# Patient Record
Sex: Male | Born: 1980 | Race: Black or African American | Hispanic: No | Marital: Married | Smoking: Current every day smoker
Health system: Southern US, Community
[De-identification: ages and names within clinical notes are randomized; demographics above are authoritative.]

## PROBLEM LIST (undated history)

## (undated) DIAGNOSIS — N200 Calculus of kidney: Secondary | ICD-10-CM

## (undated) DIAGNOSIS — F329 Major depressive disorder, single episode, unspecified: Secondary | ICD-10-CM

## (undated) DIAGNOSIS — F32A Depression, unspecified: Secondary | ICD-10-CM

## (undated) HISTORY — PX: KIDNEY STONE SURGERY: SHX686

---
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
## 2005-03-13 ENCOUNTER — Emergency Department (HOSPITAL_COMMUNITY): Admission: EM | Admit: 2005-03-13 | Discharge: 2005-03-13 | Payer: Self-pay | Admitting: Emergency Medicine

## 2005-04-27 ENCOUNTER — Emergency Department (HOSPITAL_COMMUNITY): Admission: EM | Admit: 2005-04-27 | Discharge: 2005-04-28 | Payer: Self-pay | Admitting: Emergency Medicine

## 2005-07-25 ENCOUNTER — Emergency Department (HOSPITAL_COMMUNITY): Admission: EM | Admit: 2005-07-25 | Discharge: 2005-07-25 | Payer: Self-pay | Admitting: Emergency Medicine

## 2005-09-28 ENCOUNTER — Emergency Department (HOSPITAL_COMMUNITY): Admission: EM | Admit: 2005-09-28 | Discharge: 2005-09-28 | Payer: Self-pay | Admitting: Emergency Medicine

## 2006-01-09 DIAGNOSIS — K047 Periapical abscess without sinus: Secondary | ICD-10-CM | POA: Insufficient documentation

## 2006-01-10 ENCOUNTER — Ambulatory Visit: Payer: Self-pay | Admitting: Internal Medicine

## 2006-01-11 ENCOUNTER — Ambulatory Visit: Payer: Self-pay | Admitting: *Deleted

## 2006-03-15 ENCOUNTER — Emergency Department (HOSPITAL_COMMUNITY): Admission: EM | Admit: 2006-03-15 | Discharge: 2006-03-15 | Payer: Self-pay | Admitting: Emergency Medicine

## 2006-03-17 ENCOUNTER — Emergency Department (HOSPITAL_COMMUNITY): Admission: EM | Admit: 2006-03-17 | Discharge: 2006-03-18 | Payer: Self-pay | Admitting: Emergency Medicine

## 2006-03-20 ENCOUNTER — Emergency Department (HOSPITAL_COMMUNITY): Admission: EM | Admit: 2006-03-20 | Discharge: 2006-03-20 | Payer: Self-pay | Admitting: Family Medicine

## 2006-04-25 ENCOUNTER — Ambulatory Visit: Payer: Self-pay | Admitting: Family Medicine

## 2006-05-01 ENCOUNTER — Emergency Department (HOSPITAL_COMMUNITY): Admission: EM | Admit: 2006-05-01 | Discharge: 2006-05-01 | Payer: Self-pay | Admitting: Emergency Medicine

## 2006-05-05 ENCOUNTER — Ambulatory Visit (HOSPITAL_COMMUNITY): Admission: RE | Admit: 2006-05-05 | Discharge: 2006-05-05 | Payer: Self-pay | Admitting: Urology

## 2006-07-17 ENCOUNTER — Emergency Department (HOSPITAL_COMMUNITY): Admission: EM | Admit: 2006-07-17 | Discharge: 2006-07-17 | Payer: Self-pay | Admitting: Emergency Medicine

## 2006-07-18 ENCOUNTER — Ambulatory Visit: Payer: Self-pay | Admitting: Internal Medicine

## 2006-07-18 DIAGNOSIS — M7650 Patellar tendinitis, unspecified knee: Secondary | ICD-10-CM

## 2006-07-18 DIAGNOSIS — Z8739 Personal history of other diseases of the musculoskeletal system and connective tissue: Secondary | ICD-10-CM | POA: Insufficient documentation

## 2006-07-20 ENCOUNTER — Ambulatory Visit: Payer: Self-pay | Admitting: Internal Medicine

## 2006-08-10 ENCOUNTER — Ambulatory Visit: Payer: Self-pay | Admitting: Internal Medicine

## 2006-12-05 ENCOUNTER — Emergency Department (HOSPITAL_COMMUNITY): Admission: EM | Admit: 2006-12-05 | Discharge: 2006-12-05 | Payer: Self-pay | Admitting: *Deleted

## 2006-12-06 ENCOUNTER — Emergency Department (HOSPITAL_COMMUNITY): Admission: EM | Admit: 2006-12-06 | Discharge: 2006-12-06 | Payer: Self-pay | Admitting: *Deleted

## 2007-03-27 ENCOUNTER — Ambulatory Visit: Payer: Self-pay | Admitting: Family Medicine

## 2007-03-27 DIAGNOSIS — Z87442 Personal history of urinary calculi: Secondary | ICD-10-CM

## 2007-06-13 ENCOUNTER — Encounter (INDEPENDENT_AMBULATORY_CARE_PROVIDER_SITE_OTHER): Payer: Self-pay | Admitting: *Deleted

## 2007-07-15 ENCOUNTER — Emergency Department (HOSPITAL_COMMUNITY): Admission: EM | Admit: 2007-07-15 | Discharge: 2007-07-15 | Payer: Self-pay | Admitting: Emergency Medicine

## 2007-10-17 ENCOUNTER — Ambulatory Visit: Payer: Self-pay | Admitting: Nurse Practitioner

## 2008-12-17 ENCOUNTER — Emergency Department (HOSPITAL_COMMUNITY): Admission: EM | Admit: 2008-12-17 | Discharge: 2008-12-17 | Payer: Self-pay | Admitting: Emergency Medicine

## 2009-03-06 ENCOUNTER — Emergency Department (HOSPITAL_COMMUNITY): Admission: EM | Admit: 2009-03-06 | Discharge: 2009-03-07 | Payer: Self-pay | Admitting: Emergency Medicine

## 2009-03-21 ENCOUNTER — Emergency Department (HOSPITAL_COMMUNITY): Admission: EM | Admit: 2009-03-21 | Discharge: 2009-03-21 | Payer: Self-pay | Admitting: Emergency Medicine

## 2009-05-09 ENCOUNTER — Emergency Department (HOSPITAL_COMMUNITY): Admission: EM | Admit: 2009-05-09 | Discharge: 2009-05-09 | Payer: Self-pay | Admitting: Emergency Medicine

## 2009-05-14 ENCOUNTER — Emergency Department (HOSPITAL_COMMUNITY): Admission: EM | Admit: 2009-05-14 | Discharge: 2009-05-14 | Payer: Self-pay | Admitting: Emergency Medicine

## 2009-08-10 ENCOUNTER — Emergency Department (HOSPITAL_COMMUNITY): Admission: EM | Admit: 2009-08-10 | Discharge: 2009-08-10 | Payer: Self-pay | Admitting: Family Medicine

## 2009-08-10 ENCOUNTER — Emergency Department (HOSPITAL_COMMUNITY): Admission: EM | Admit: 2009-08-10 | Discharge: 2009-08-10 | Payer: Self-pay | Admitting: Emergency Medicine

## 2009-11-17 ENCOUNTER — Emergency Department (HOSPITAL_COMMUNITY): Admission: EM | Admit: 2009-11-17 | Discharge: 2009-11-17 | Payer: Self-pay | Admitting: Emergency Medicine

## 2009-11-18 ENCOUNTER — Emergency Department (HOSPITAL_COMMUNITY): Admission: EM | Admit: 2009-11-18 | Discharge: 2009-11-18 | Payer: Self-pay | Admitting: Emergency Medicine

## 2009-12-08 ENCOUNTER — Emergency Department (HOSPITAL_COMMUNITY): Admission: EM | Admit: 2009-12-08 | Discharge: 2009-12-08 | Payer: Self-pay | Admitting: Emergency Medicine

## 2010-12-15 LAB — CBC
HCT: 43.1 % (ref 39.0–52.0)
Hemoglobin: 14.8 g/dL (ref 13.0–17.0)
MCHC: 34.5 g/dL (ref 30.0–36.0)
MCV: 88.3 fL (ref 78.0–100.0)
Platelets: 229 10*3/uL (ref 150–400)
RBC: 4.88 MIL/uL (ref 4.22–5.81)
RDW: 13.9 % (ref 11.5–15.5)
WBC: 5.8 10*3/uL (ref 4.0–10.5)

## 2010-12-15 LAB — POCT CARDIAC MARKERS
CKMB, poc: <1 ng/mL — ABNORMAL LOW (ref 1.0–8.0)
Myoglobin, poc: 26.5 ng/mL (ref 12–200)
Troponin i, poc: <0.05 ng/mL (ref 0.00–0.09)

## 2010-12-15 LAB — DIFFERENTIAL
Basophils Absolute: 0 10*3/uL (ref 0.0–0.1)
Basophils Relative: 0 % (ref 0–1)
Eosinophils Absolute: 0 10*3/uL (ref 0.0–0.7)
Eosinophils Relative: 0 % (ref 0–5)
Lymphocytes Relative: 38 % (ref 12–46)
Lymphs Abs: 2.2 10*3/uL (ref 0.7–4.0)
Monocytes Absolute: 0.6 10*3/uL (ref 0.1–1.0)
Monocytes Relative: 11 % (ref 3–12)
Neutro Abs: 3 10*3/uL (ref 1.7–7.7)
Neutrophils Relative %: 51 % (ref 43–77)

## 2010-12-15 LAB — BASIC METABOLIC PANEL
BUN: 10 mg/dL (ref 6–23)
CO2: 27 mEq/L (ref 19–32)
Calcium: 9.5 mg/dL (ref 8.4–10.5)
Chloride: 107 mEq/L (ref 96–112)
Creatinine, Ser: 0.9 mg/dL (ref 0.4–1.5)
GFR calc Af Amer: >60 mL/min (ref >60)
GFR calc non Af Amer: >60 mL/min (ref >60)
Glucose, Bld: 104 mg/dL — ABNORMAL HIGH (ref 70–99)
Potassium: 3.4 mEq/L — ABNORMAL LOW (ref 3.5–5.1)
Sodium: 139 mEq/L (ref 135–145)

## 2010-12-15 LAB — D-DIMER, QUANTITATIVE: D-Dimer, Quant: <0.22 ug/mL-FEU (ref 0.00–0.48)

## 2010-12-29 LAB — GC/CHLAMYDIA PROBE AMP, GENITAL
Chlamydia, DNA Probe: NEGATIVE
GC Probe Amp, Genital: POSITIVE — AB

## 2011-01-01 LAB — GC/CHLAMYDIA PROBE AMP, GENITAL: Chlamydia, DNA Probe: NEGATIVE

## 2011-01-03 LAB — POCT CARDIAC MARKERS
CKMB, poc: <1 ng/mL — ABNORMAL LOW (ref 1.0–8.0)
Myoglobin, poc: 91.3 ng/mL (ref 12–200)
Troponin i, poc: <0.05 ng/mL (ref 0.00–0.09)

## 2011-01-03 LAB — POCT I-STAT, CHEM 8
BUN: 10 mg/dL (ref 6–23)
Calcium, Ion: 1.26 mmol/L (ref 1.12–1.32)
HCT: 47 % (ref 39.0–52.0)
Sodium: 142 mEq/L (ref 135–145)
TCO2: 22 mmol/L (ref 0–100)

## 2011-01-06 LAB — BASIC METABOLIC PANEL
BUN: 15 mg/dL (ref 6–23)
CO2: 28 mEq/L (ref 19–32)
Calcium: 9.7 mg/dL (ref 8.4–10.5)
Chloride: 105 mEq/L (ref 96–112)
Creatinine, Ser: 0.87 mg/dL (ref 0.4–1.5)
GFR calc Af Amer: >60 mL/min (ref >60)
Glucose, Bld: 92 mg/dL (ref 70–99)

## 2011-01-06 LAB — DIFFERENTIAL
Basophils Absolute: 0 10*3/uL (ref 0.0–0.1)
Basophils Relative: 0 % (ref 0–1)
Eosinophils Absolute: 0.1 10*3/uL (ref 0.0–0.7)
Lymphocytes Relative: 25 % (ref 12–46)
Monocytes Absolute: 0.4 10*3/uL (ref 0.1–1.0)
Neutrophils Relative %: 68 % (ref 43–77)

## 2011-01-06 LAB — CBC
MCHC: 33.3 g/dL (ref 30.0–36.0)
MCV: 89.9 fL (ref 78.0–100.0)
Platelets: 207 10*3/uL (ref 150–400)
RDW: 15.2 % (ref 11.5–15.5)

## 2011-01-06 LAB — SICKLE CELL SCREEN: Sickle Cell Screen: NEGATIVE

## 2011-02-11 NOTE — Op Note (Signed)
NAME:  Ryan Duran, Ryan Duran               ACCOUNT NO.:  [id number]   MEDICAL RECORD NO.:  [id number]          PATIENT TYPE:  AMB   LOCATION:  DAY                          FACILITY:  [facility]   PHYSICIAN:  Boston Service, M.D.DATE OF BIRTH:  02/04/1983   DATE OF PROCEDURE:  04/27/2006  DATE OF DISCHARGE:  05/05/2006                                 OPERATIVE REPORT   PREOPERATIVE DIAGNOSIS:  Right ureteral stone times 6-8 weeks.  The patient  has apparently had six visits to the emergency room over the last 12 months,  I have made a good faith effort to review those records.   POSTOPERATIVE DIAGNOSIS:  Right ureteral stone times 6-8 weeks.  The patient  has apparently had six visits to the emergency room over the last 12 months,  I have made a good faith effort to review those records.   PROCEDURE:  Cystoscopy, ureteroscopy and stone manipulation.  Reference is  made to retrograde films.   SURGEON:  Boston Service, MD.   ASSISTANTS:  None.   ANESTHESIA:  General.   SPECIMENS:  Stone.   ESTIMATED BLOOD LOSS:  Minimal.   COMPLICATIONS:  None obvious.   DESCRIPTION OF PROCEDURE:  The patient was prepped and draped in the  dorsolithotomy position after institution of adequate level of general  anesthesia.  A well lubricated 21-French panendoscope was gently inserted at  the urethral meatus, normal urethra and sphincter, nonobstructive prostate,  clear efflux left orifice, minimal efflux right orifice.   Blocking catheter was selected, positioned at the left ureteral orifice.  Retrograde films with 3-6 mL of contrast outlined fairly normal course and  caliber of the ureter, pelvis and calyces.  Similar technique was used on  the right side and showed what appeared to be about 10 mm filling defect  within the right mid ureter.  Difficult to be certain if this represented  one stone or two smaller stones in fairly close proximity.  Once retrograde  films had been obtained, floppy tip  guidewire was inserted at the right  ureteral orifice, advanced easily into the upper pole calyces and  ureteroscope was then inserted alongside the guide wire.  Stone was  localized within the distal ureter instead of single stone, it was shown to  be a collection of 2 or 3 smaller stones which were negotiated into the Bard  basket and then gently withdrawn.  Ureteroscope was reinserted, ureter was  carefully inspected to the limit of the short 6-French scope and there was  no evidence of ureteral trauma or disruption.  There was also no evidence of  retained  stony fragments.  Ureteroscope was then gently withdrawn as was the floppy  tip guidewire.  There was prompt reflux of pink urine from the right  ureteral orifice.  No attempt was made to place a double-J stent the  patient's bladder was drained.  He was given a B&P suppository and returned  to recovery in satisfactory condition.           ______________________________  Boston Service, M.D.     RH/MEDQ  D:  06/01/2006  T:  06/01/2006  Job:  [id number]

## 2011-03-04 ENCOUNTER — Inpatient Hospital Stay (INDEPENDENT_AMBULATORY_CARE_PROVIDER_SITE_OTHER)
Admission: RE | Admit: 2011-03-04 | Discharge: 2011-03-04 | Disposition: A | Discharge status: Home / Self Care | Payer: Self-pay | Source: Ambulatory Visit | Attending: Emergency Medicine | Admitting: Emergency Medicine

## 2011-03-04 DIAGNOSIS — R369 Urethral discharge, unspecified: Secondary | ICD-10-CM

## 2011-03-08 ENCOUNTER — Emergency Department (HOSPITAL_COMMUNITY)
Admission: EM | Admit: 2011-03-08 | Discharge: 2011-03-08 | Disposition: A | Discharge status: Home / Self Care | Payer: Self-pay | Attending: Emergency Medicine | Admitting: Emergency Medicine

## 2011-03-08 DIAGNOSIS — K029 Dental caries, unspecified: Secondary | ICD-10-CM | POA: Insufficient documentation

## 2011-03-08 DIAGNOSIS — J45909 Unspecified asthma, uncomplicated: Secondary | ICD-10-CM | POA: Insufficient documentation

## 2011-03-08 DIAGNOSIS — S30860A Insect bite (nonvenomous) of lower back and pelvis, initial encounter: Secondary | ICD-10-CM | POA: Insufficient documentation

## 2011-03-08 DIAGNOSIS — W57XXXA Bitten or stung by nonvenomous insect and other nonvenomous arthropods, initial encounter: Secondary | ICD-10-CM | POA: Insufficient documentation

## 2011-03-08 DIAGNOSIS — K089 Disorder of teeth and supporting structures, unspecified: Secondary | ICD-10-CM | POA: Insufficient documentation

## 2011-04-22 ENCOUNTER — Encounter (HOSPITAL_COMMUNITY): Payer: Self-pay

## 2011-04-22 ENCOUNTER — Emergency Department (HOSPITAL_COMMUNITY)
Admission: EM | Admit: 2011-04-22 | Discharge: 2011-04-22 | Disposition: A | Discharge status: Home / Self Care | Payer: Self-pay | Attending: Emergency Medicine | Admitting: Emergency Medicine

## 2011-04-22 ENCOUNTER — Emergency Department (HOSPITAL_COMMUNITY): Payer: Self-pay

## 2011-04-22 DIAGNOSIS — M25519 Pain in unspecified shoulder: Secondary | ICD-10-CM | POA: Insufficient documentation

## 2011-04-22 DIAGNOSIS — S40019A Contusion of unspecified shoulder, initial encounter: Secondary | ICD-10-CM | POA: Insufficient documentation

## 2011-04-22 DIAGNOSIS — Y92009 Unspecified place in unspecified non-institutional (private) residence as the place of occurrence of the external cause: Secondary | ICD-10-CM | POA: Insufficient documentation

## 2011-04-22 DIAGNOSIS — W010XXA Fall on same level from slipping, tripping and stumbling without subsequent striking against object, initial encounter: Secondary | ICD-10-CM | POA: Insufficient documentation

## 2011-05-01 ENCOUNTER — Emergency Department (HOSPITAL_COMMUNITY)
Admission: EM | Admit: 2011-05-01 | Discharge: 2011-05-01 | Disposition: A | Discharge status: Home / Self Care | Payer: Self-pay | Attending: Emergency Medicine | Admitting: Emergency Medicine

## 2011-05-01 ENCOUNTER — Emergency Department (HOSPITAL_COMMUNITY): Payer: Self-pay

## 2011-05-01 DIAGNOSIS — K029 Dental caries, unspecified: Secondary | ICD-10-CM | POA: Insufficient documentation

## 2011-05-01 DIAGNOSIS — R0609 Other forms of dyspnea: Secondary | ICD-10-CM | POA: Insufficient documentation

## 2011-05-01 DIAGNOSIS — R0602 Shortness of breath: Secondary | ICD-10-CM | POA: Insufficient documentation

## 2011-05-01 DIAGNOSIS — R0989 Other specified symptoms and signs involving the circulatory and respiratory systems: Secondary | ICD-10-CM | POA: Insufficient documentation

## 2011-05-01 DIAGNOSIS — K089 Disorder of teeth and supporting structures, unspecified: Secondary | ICD-10-CM | POA: Insufficient documentation

## 2011-05-01 DIAGNOSIS — J45901 Unspecified asthma with (acute) exacerbation: Secondary | ICD-10-CM | POA: Insufficient documentation

## 2011-05-01 DIAGNOSIS — K047 Periapical abscess without sinus: Secondary | ICD-10-CM | POA: Insufficient documentation

## 2011-05-01 DIAGNOSIS — R5381 Other malaise: Secondary | ICD-10-CM | POA: Insufficient documentation

## 2011-05-01 DIAGNOSIS — R110 Nausea: Secondary | ICD-10-CM | POA: Insufficient documentation

## 2011-05-01 DIAGNOSIS — R6883 Chills (without fever): Secondary | ICD-10-CM | POA: Insufficient documentation

## 2011-05-01 LAB — CBC
HCT: 43.7 % (ref 39.0–52.0)
MCH: 29.8 pg (ref 26.0–34.0)
MCV: 86.2 fL (ref 78.0–100.0)
RBC: 5.07 MIL/uL (ref 4.22–5.81)
RDW: 13.9 % (ref 11.5–15.5)
WBC: 6 10*3/uL (ref 4.0–10.5)

## 2011-05-01 LAB — URINALYSIS, ROUTINE W REFLEX MICROSCOPIC
Bilirubin Urine: NEGATIVE
Glucose, UA: NEGATIVE mg/dL
Hgb urine dipstick: NEGATIVE
Protein, ur: NEGATIVE mg/dL

## 2011-05-01 LAB — POCT I-STAT, CHEM 8
BUN: <3 mg/dL — ABNORMAL LOW (ref 6–23)
Creatinine, Ser: 1 mg/dL (ref 0.50–1.35)
Glucose, Bld: 86 mg/dL (ref 70–99)
Sodium: 140 mEq/L (ref 135–145)
TCO2: 28 mmol/L (ref 0–100)

## 2011-05-01 LAB — RAPID URINE DRUG SCREEN, HOSP PERFORMED
Amphetamines: NOT DETECTED
Barbiturates: NOT DETECTED
Benzodiazepines: NOT DETECTED

## 2011-05-14 ENCOUNTER — Emergency Department (HOSPITAL_COMMUNITY)
Admission: EM | Admit: 2011-05-14 | Discharge: 2011-05-14 | Disposition: A | Discharge status: Home / Self Care | Payer: Self-pay | Attending: Emergency Medicine | Admitting: Emergency Medicine

## 2011-05-14 ENCOUNTER — Emergency Department (HOSPITAL_COMMUNITY): Payer: Self-pay

## 2011-05-14 DIAGNOSIS — R112 Nausea with vomiting, unspecified: Secondary | ICD-10-CM | POA: Insufficient documentation

## 2011-05-14 DIAGNOSIS — J45909 Unspecified asthma, uncomplicated: Secondary | ICD-10-CM | POA: Insufficient documentation

## 2011-05-14 DIAGNOSIS — R109 Unspecified abdominal pain: Secondary | ICD-10-CM | POA: Insufficient documentation

## 2011-05-14 LAB — URINALYSIS, ROUTINE W REFLEX MICROSCOPIC
Ketones, ur: NEGATIVE mg/dL
Nitrite: NEGATIVE
Specific Gravity, Urine: 1.021 (ref 1.005–1.030)
pH: 8 (ref 5.0–8.0)

## 2011-05-14 LAB — CBC
Platelets: 225 10*3/uL (ref 150–400)
RBC: 5.41 MIL/uL (ref 4.22–5.81)
WBC: 8.7 10*3/uL (ref 4.0–10.5)

## 2011-05-14 LAB — DIFFERENTIAL
Basophils Absolute: 0.1 10*3/uL (ref 0.0–0.1)
Basophils Relative: 1 % (ref 0–1)
Eosinophils Absolute: 0.1 10*3/uL (ref 0.0–0.7)
Neutro Abs: 5.6 10*3/uL (ref 1.7–7.7)
Neutrophils Relative %: 64 % (ref 43–77)

## 2011-05-14 LAB — URINE MICROSCOPIC-ADD ON

## 2011-05-14 LAB — BASIC METABOLIC PANEL
Calcium: 10.1 mg/dL (ref 8.4–10.5)
GFR calc non Af Amer: >60 mL/min (ref >60)
Glucose, Bld: 78 mg/dL (ref 70–99)
Sodium: 137 mEq/L (ref 135–145)

## 2011-06-07 ENCOUNTER — Emergency Department (HOSPITAL_COMMUNITY): Payer: Self-pay

## 2011-06-07 ENCOUNTER — Emergency Department (HOSPITAL_COMMUNITY)
Admission: EM | Admit: 2011-06-07 | Discharge: 2011-06-07 | Disposition: A | Discharge status: Home / Self Care | Payer: Self-pay | Attending: Emergency Medicine | Admitting: Emergency Medicine

## 2011-06-07 ENCOUNTER — Encounter (HOSPITAL_COMMUNITY): Payer: Self-pay

## 2011-06-07 DIAGNOSIS — R1031 Right lower quadrant pain: Secondary | ICD-10-CM | POA: Insufficient documentation

## 2011-06-07 DIAGNOSIS — R300 Dysuria: Secondary | ICD-10-CM | POA: Insufficient documentation

## 2011-06-07 DIAGNOSIS — M545 Low back pain, unspecified: Secondary | ICD-10-CM | POA: Insufficient documentation

## 2011-06-07 DIAGNOSIS — Z87442 Personal history of urinary calculi: Secondary | ICD-10-CM | POA: Insufficient documentation

## 2011-06-07 DIAGNOSIS — J45909 Unspecified asthma, uncomplicated: Secondary | ICD-10-CM | POA: Insufficient documentation

## 2011-06-07 HISTORY — DX: Calculus of kidney: N20.0

## 2011-06-07 LAB — CBC
Hemoglobin: 14.9 g/dL (ref 13.0–17.0)
MCH: 28.6 pg (ref 26.0–34.0)
MCHC: 33.6 g/dL (ref 30.0–36.0)
MCV: 85 fL (ref 78.0–100.0)
RBC: 5.21 MIL/uL (ref 4.22–5.81)

## 2011-06-07 LAB — DIFFERENTIAL
Basophils Relative: 0 % (ref 0–1)
Lymphs Abs: 3.6 10*3/uL (ref 0.7–4.0)
Monocytes Absolute: 0.6 10*3/uL (ref 0.1–1.0)
Monocytes Relative: 9 % (ref 3–12)
Neutro Abs: 2.3 10*3/uL (ref 1.7–7.7)
Neutrophils Relative %: 35 % — ABNORMAL LOW (ref 43–77)

## 2011-06-07 LAB — POCT I-STAT, CHEM 8
Calcium, Ion: 1.21 mmol/L (ref 1.12–1.32)
Glucose, Bld: 88 mg/dL (ref 70–99)
Potassium: 3.5 mEq/L (ref 3.5–5.1)
TCO2: 27 mmol/L (ref 0–100)

## 2011-06-07 LAB — URINALYSIS, ROUTINE W REFLEX MICROSCOPIC
Bilirubin Urine: NEGATIVE
Glucose, UA: NEGATIVE mg/dL
Ketones, ur: NEGATIVE mg/dL
Protein, ur: NEGATIVE mg/dL

## 2011-06-07 LAB — URINE MICROSCOPIC-ADD ON

## 2011-06-07 MED ORDER — IOHEXOL 300 MG/ML  SOLN
80.0000 mL | Freq: Once | INTRAMUSCULAR | Status: AC | PRN
  Administered 2011-06-07: 80 mL via INTRAVENOUS

## 2011-06-08 LAB — URINE CULTURE
Colony Count: NO GROWTH
Culture  Setup Time: 201209111305

## 2011-06-16 ENCOUNTER — Emergency Department (HOSPITAL_COMMUNITY)
Admission: EM | Admit: 2011-06-16 | Discharge: 2011-06-17 | Disposition: A | Discharge status: Home / Self Care | Payer: Self-pay | Attending: Emergency Medicine | Admitting: Emergency Medicine

## 2011-06-16 DIAGNOSIS — J45909 Unspecified asthma, uncomplicated: Secondary | ICD-10-CM | POA: Insufficient documentation

## 2011-06-16 DIAGNOSIS — R55 Syncope and collapse: Secondary | ICD-10-CM | POA: Insufficient documentation

## 2011-06-16 DIAGNOSIS — IMO0002 Reserved for concepts with insufficient information to code with codable children: Secondary | ICD-10-CM | POA: Insufficient documentation

## 2011-06-16 DIAGNOSIS — R51 Headache: Secondary | ICD-10-CM | POA: Insufficient documentation

## 2011-06-16 DIAGNOSIS — S7000XA Contusion of unspecified hip, initial encounter: Secondary | ICD-10-CM | POA: Insufficient documentation

## 2011-06-16 DIAGNOSIS — S0003XA Contusion of scalp, initial encounter: Secondary | ICD-10-CM | POA: Insufficient documentation

## 2011-06-16 DIAGNOSIS — M25559 Pain in unspecified hip: Secondary | ICD-10-CM | POA: Insufficient documentation

## 2011-06-16 DIAGNOSIS — R42 Dizziness and giddiness: Secondary | ICD-10-CM | POA: Insufficient documentation

## 2011-06-16 DIAGNOSIS — R296 Repeated falls: Secondary | ICD-10-CM | POA: Insufficient documentation

## 2011-06-16 DIAGNOSIS — S0083XA Contusion of other part of head, initial encounter: Secondary | ICD-10-CM | POA: Insufficient documentation

## 2011-06-16 LAB — GLUCOSE, CAPILLARY: Glucose-Capillary: 104 mg/dL — ABNORMAL HIGH (ref 70–99)

## 2011-06-17 ENCOUNTER — Emergency Department (HOSPITAL_COMMUNITY): Payer: Self-pay

## 2011-06-17 LAB — POCT I-STAT, CHEM 8
BUN: 17 mg/dL (ref 6–23)
Calcium, Ion: 1.27 mmol/L (ref 1.12–1.32)
Creatinine, Ser: 1 mg/dL (ref 0.50–1.35)
TCO2: 26 mmol/L (ref 0–100)

## 2011-06-21 ENCOUNTER — Emergency Department (HOSPITAL_COMMUNITY)
Admission: EM | Admit: 2011-06-21 | Discharge: 2011-06-21 | Disposition: A | Discharge status: Home / Self Care | Payer: Self-pay | Attending: Emergency Medicine | Admitting: Emergency Medicine

## 2011-06-21 DIAGNOSIS — W19XXXA Unspecified fall, initial encounter: Secondary | ICD-10-CM | POA: Insufficient documentation

## 2011-06-21 DIAGNOSIS — S7000XA Contusion of unspecified hip, initial encounter: Secondary | ICD-10-CM | POA: Insufficient documentation

## 2011-06-21 DIAGNOSIS — R55 Syncope and collapse: Secondary | ICD-10-CM | POA: Insufficient documentation

## 2011-07-03 ENCOUNTER — Emergency Department (HOSPITAL_COMMUNITY)
Admission: EM | Admit: 2011-07-03 | Discharge: 2011-07-04 | Disposition: A | Discharge status: Home / Self Care | Payer: Self-pay | Attending: Emergency Medicine | Admitting: Emergency Medicine

## 2011-07-03 DIAGNOSIS — M25559 Pain in unspecified hip: Secondary | ICD-10-CM | POA: Insufficient documentation

## 2011-07-03 DIAGNOSIS — R05 Cough: Secondary | ICD-10-CM | POA: Insufficient documentation

## 2011-07-03 DIAGNOSIS — R0602 Shortness of breath: Secondary | ICD-10-CM | POA: Insufficient documentation

## 2011-07-03 DIAGNOSIS — S7000XA Contusion of unspecified hip, initial encounter: Secondary | ICD-10-CM | POA: Insufficient documentation

## 2011-07-03 DIAGNOSIS — R55 Syncope and collapse: Secondary | ICD-10-CM | POA: Insufficient documentation

## 2011-07-03 DIAGNOSIS — R509 Fever, unspecified: Secondary | ICD-10-CM | POA: Insufficient documentation

## 2011-07-03 DIAGNOSIS — W19XXXA Unspecified fall, initial encounter: Secondary | ICD-10-CM | POA: Insufficient documentation

## 2011-07-03 DIAGNOSIS — R110 Nausea: Secondary | ICD-10-CM | POA: Insufficient documentation

## 2011-07-03 DIAGNOSIS — J45909 Unspecified asthma, uncomplicated: Secondary | ICD-10-CM | POA: Insufficient documentation

## 2011-07-03 DIAGNOSIS — R5381 Other malaise: Secondary | ICD-10-CM | POA: Insufficient documentation

## 2011-07-03 DIAGNOSIS — B9789 Other viral agents as the cause of diseases classified elsewhere: Secondary | ICD-10-CM | POA: Insufficient documentation

## 2011-07-03 DIAGNOSIS — R059 Cough, unspecified: Secondary | ICD-10-CM | POA: Insufficient documentation

## 2011-07-03 DIAGNOSIS — J3489 Other specified disorders of nose and nasal sinuses: Secondary | ICD-10-CM | POA: Insufficient documentation

## 2011-07-04 ENCOUNTER — Emergency Department (HOSPITAL_COMMUNITY): Payer: Self-pay

## 2011-07-04 LAB — DIFFERENTIAL
Basophils Relative: 1 % (ref 0–1)
Eosinophils Absolute: 0.3 10*3/uL (ref 0.0–0.7)
Lymphs Abs: 2.2 10*3/uL (ref 0.7–4.0)
Monocytes Absolute: 0.5 10*3/uL (ref 0.1–1.0)
Monocytes Relative: 7 % (ref 3–12)
Neutro Abs: 4.4 10*3/uL (ref 1.7–7.7)

## 2011-07-04 LAB — URINALYSIS, ROUTINE W REFLEX MICROSCOPIC
Bilirubin Urine: NEGATIVE
Glucose, UA: NEGATIVE mg/dL
Hgb urine dipstick: NEGATIVE
Ketones, ur: NEGATIVE mg/dL
Protein, ur: NEGATIVE mg/dL
pH: 6 (ref 5.0–8.0)

## 2011-07-04 LAB — CBC
Hemoglobin: 13 g/dL (ref 13.0–17.0)
MCH: 28.6 pg (ref 26.0–34.0)
MCHC: 33.5 g/dL (ref 30.0–36.0)
MCV: 85.5 fL (ref 78.0–100.0)

## 2011-07-04 LAB — BASIC METABOLIC PANEL
BUN: 11 mg/dL (ref 6–23)
Calcium: 8.9 mg/dL (ref 8.4–10.5)
Creatinine, Ser: 0.73 mg/dL (ref 0.50–1.35)
GFR calc non Af Amer: >90 mL/min (ref >90)
Glucose, Bld: 79 mg/dL (ref 70–99)
Sodium: 139 mEq/L (ref 135–145)

## 2011-08-06 ENCOUNTER — Encounter (HOSPITAL_COMMUNITY): Payer: Self-pay | Admitting: *Deleted

## 2011-08-06 ENCOUNTER — Emergency Department (HOSPITAL_COMMUNITY)
Admission: EM | Admit: 2011-08-06 | Discharge: 2011-08-06 | Disposition: A | Discharge status: Home / Self Care | Payer: Self-pay | Attending: Emergency Medicine | Admitting: Emergency Medicine

## 2011-08-06 DIAGNOSIS — K089 Disorder of teeth and supporting structures, unspecified: Secondary | ICD-10-CM | POA: Insufficient documentation

## 2011-08-06 DIAGNOSIS — R220 Localized swelling, mass and lump, head: Secondary | ICD-10-CM | POA: Insufficient documentation

## 2011-08-06 DIAGNOSIS — K047 Periapical abscess without sinus: Secondary | ICD-10-CM | POA: Insufficient documentation

## 2011-08-06 DIAGNOSIS — E119 Type 2 diabetes mellitus without complications: Secondary | ICD-10-CM | POA: Insufficient documentation

## 2011-08-06 DIAGNOSIS — J45909 Unspecified asthma, uncomplicated: Secondary | ICD-10-CM | POA: Insufficient documentation

## 2011-08-06 DIAGNOSIS — K029 Dental caries, unspecified: Secondary | ICD-10-CM | POA: Insufficient documentation

## 2011-08-06 HISTORY — DX: Calculus of kidney: N20.0

## 2011-08-06 MED ORDER — CLINDAMYCIN HCL 150 MG PO CAPS: 150.0000 mg | ORAL_CAPSULE | Freq: Three times a day (TID) | ORAL | Status: AC

## 2011-08-06 MED ORDER — HYDROCODONE-ACETAMINOPHEN 10-500 MG PO TABS: 1.0000 | ORAL_TABLET | Freq: Four times a day (QID) | ORAL | Status: AC | PRN

## 2011-08-06 MED ORDER — HYDROCODONE-ACETAMINOPHEN 5-325 MG PO TABS
2.0000 | ORAL_TABLET | Freq: Once | ORAL | Status: AC
  Administered 2011-08-06: 2 via ORAL
  Filled 2011-08-06: qty 2

## 2011-08-06 MED ORDER — CLINDAMYCIN HCL 150 MG PO CAPS
300.0000 mg | ORAL_CAPSULE | Freq: Once | ORAL | Status: AC
  Administered 2011-08-06: 300 mg via ORAL
  Filled 2011-08-06: qty 2

## 2011-08-06 NOTE — ED Notes (Addendum)
C/o L lower dental pain, "thinks there is an abcess", "gums tender to palpation", onset 1.5 weeks ago, mentions "loose tooth", has not eaten in 4d d/t pain, swelling was worse yesterday, "swelling less, but not the pain". Has been taking ibuprofen 800mg  "2-3 at a time for 4d". Aware [facility], but cannot wait til then.

## 2011-08-06 NOTE — ED Provider Notes (Signed)
History     CSN: [id number] Arrival date & time: 08/06/2011  2:06 AM   First MD Initiated Contact with Patient 08/06/11 [phone]      Chief Complaint  Patient presents with  . Dental Pain    (Consider location/radiation/quality/duration/timing/severity/associated sxs/prior treatment) HPI He has about a week and a half history of pain in his left lower second molar. The pain has become severe, worse with eating to the point that he is not able to eat. There is swelling of the gum adjacent to the tooth. He has been taking ibuprofen without relief. There's been no associated cervical lymphadenopathy.  Past Medical History  Diagnosis Date  . Calcium oxalate renal stones t  . Kidney stone   . Asthma   . Diabetes mellitus     Past Surgical History  Procedure Date  . Kidney stone surgery     Family History  Problem Relation Age of Onset  . Arrhythmia Mother   . Heart failure Father     History  Substance Use Topics  . Smoking status: Current Everyday Smoker -- 0.5 packs/day  . Smokeless tobacco: Not on file  . Alcohol Use: No      Review of Systems  All other systems reviewed and are negative.    Allergies  Penicillins  Home Medications   Current Outpatient Rx  Name Route Sig Dispense Refill  . IBUPROFEN 200 MG PO TABS Oral Take 200 mg by mouth every 6 (six) hours as needed. For pain       BP 134/94  Pulse 81  Temp(Src) 97.8 F (36.6 C) (Oral)  Resp 15  SpO2 98%  Physical Exam General: Well-developed, well-nourished male in no acute distress; appearance consistent with age of record HENT: normocephalic, atraumatic; several carious teeth; carious left lower second molar with adjacent swelling and severe tenderness; absence of left lower first and third molar Eyes: normal appearance Neck: supple; no lymphadenopathy Heart: regular rate and rhythm Lungs: normal respiratory effort and excursion Abdomen: soft; nondistended Extremities: No deformity; full  range of motion Neurologic: Awake, alert; motor function intact in all extremities and symmetric; no facial droop Skin: Warm and dry Psychiatric: appears uncomfortable    ED Course  Procedures (including critical care time)   MDM          Hanley Seamen, MD 08/06/11 [id number]

## 2011-12-15 ENCOUNTER — Emergency Department (HOSPITAL_COMMUNITY)
Admission: EM | Admit: 2011-12-15 | Discharge: 2011-12-16 | Discharge status: Against Medical Advice | Payer: Self-pay | Attending: Emergency Medicine | Admitting: Emergency Medicine

## 2011-12-15 ENCOUNTER — Encounter (HOSPITAL_COMMUNITY): Payer: Self-pay | Admitting: *Deleted

## 2011-12-15 DIAGNOSIS — K089 Disorder of teeth and supporting structures, unspecified: Secondary | ICD-10-CM | POA: Insufficient documentation
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
## 2012-01-17 ENCOUNTER — Emergency Department (HOSPITAL_COMMUNITY)
Admission: EM | Admit: 2012-01-17 | Discharge: 2012-01-18 | Disposition: A | Discharge status: Home / Self Care | Payer: Self-pay | Attending: Emergency Medicine | Admitting: Emergency Medicine

## 2012-01-17 ENCOUNTER — Encounter (HOSPITAL_COMMUNITY): Payer: Self-pay | Admitting: *Deleted

## 2012-01-17 DIAGNOSIS — J45909 Unspecified asthma, uncomplicated: Secondary | ICD-10-CM | POA: Insufficient documentation

## 2012-01-17 DIAGNOSIS — K0889 Other specified disorders of teeth and supporting structures: Secondary | ICD-10-CM

## 2012-01-17 DIAGNOSIS — F17200 Nicotine dependence, unspecified, uncomplicated: Secondary | ICD-10-CM | POA: Insufficient documentation

## 2012-01-17 DIAGNOSIS — Z87442 Personal history of urinary calculi: Secondary | ICD-10-CM | POA: Insufficient documentation

## 2012-01-17 DIAGNOSIS — E119 Type 2 diabetes mellitus without complications: Secondary | ICD-10-CM | POA: Insufficient documentation

## 2012-01-17 DIAGNOSIS — Z79899 Other long term (current) drug therapy: Secondary | ICD-10-CM | POA: Insufficient documentation

## 2012-01-17 DIAGNOSIS — K0381 Cracked tooth: Secondary | ICD-10-CM | POA: Insufficient documentation

## 2012-01-17 DIAGNOSIS — K089 Disorder of teeth and supporting structures, unspecified: Secondary | ICD-10-CM | POA: Insufficient documentation

## 2012-01-17 DIAGNOSIS — K0510 Chronic gingivitis, plaque induced: Secondary | ICD-10-CM | POA: Insufficient documentation

## 2012-01-17 MED ORDER — IBUPROFEN 200 MG PO TABS
400.0000 mg | ORAL_TABLET | Freq: Once | ORAL | Status: AC
  Administered 2012-01-18: 400 mg via ORAL
  Filled 2012-01-17: qty 1
  Filled 2012-01-17: qty 2

## 2012-01-17 MED ORDER — NAPROXEN 500 MG PO TABS: 500.0000 mg | ORAL_TABLET | Freq: Two times a day (BID) | ORAL | Status: AC | PRN

## 2012-01-17 MED ORDER — PENICILLIN V POTASSIUM 500 MG PO TABS: 500.0000 mg | ORAL_TABLET | Freq: Four times a day (QID) | ORAL | Status: AC

## 2012-01-17 MED ORDER — OXYCODONE-ACETAMINOPHEN 5-325 MG PO TABS: 1.0000 | ORAL_TABLET | ORAL | Status: AC | PRN

## 2012-01-17 NOTE — ED Provider Notes (Signed)
History    31 year old male with toothache. Tooth is cracked. Has been bothering patient for a period of several months but acutely worse with the last day. Denies trauma. No fevers or chills. No difficulty breathing or swallowing. No change in his voice. Has not seen a dentist for this. There is a smoker.   CSN: [id number]  Arrival date & time 01/17/12  2037   First MD Initiated Contact with Patient 01/17/12 2315      Chief Complaint  Patient presents with  . Dental Pain    (Consider location/radiation/quality/duration/timing/severity/associated sxs/prior treatment) HPI  Past Medical History  Diagnosis Date  . Calcium oxalate renal stones t  . Kidney stone   . Asthma   . Diabetes mellitus     Past Surgical History  Procedure Date  . Kidney stone surgery     Family History  Problem Relation Age of Onset  . Arrhythmia Mother   . Heart failure Father     History  Substance Use Topics  . Smoking status: Current Everyday Smoker -- 0.5 packs/day  . Smokeless tobacco: Not on file  . Alcohol Use: No      Review of Systems   Review of symptoms negative unless otherwise noted in HPI.   Allergies  Penicillins  Home Medications   Current Outpatient Rx  Name Route Sig Dispense Refill  . ACETAMINOPHEN 500 MG PO TABS Oral Take 1,000 mg by mouth every 6 (six) hours as needed. For pain.    Marland Kitchen NAPROXEN 500 MG PO TABS Oral Take 1 tablet (500 mg total) by mouth 2 (two) times daily as needed. 30 tablet 0  . OXYCODONE-ACETAMINOPHEN 5-325 MG PO TABS Oral Take 1-2 tablets by mouth every 4 (four) hours as needed for pain. 15 tablet 0  . PENICILLIN V POTASSIUM 500 MG PO TABS Oral Take 1 tablet (500 mg total) by mouth 4 (four) times daily. 28 tablet 0    BP 152/85  Pulse 51  Temp(Src) 97.9 F (36.6 C) (Oral)  Resp 18  SpO2 98%  Physical Exam  Nursing note and vitals reviewed. Constitutional: He appears well-developed and well-nourished. No distress.  HENT:  Head:  Normocephalic and atraumatic.  Mouth/Throat: Oropharynx is clear and moist.       Left lower second molar is cracked. Mild surrounding gingivitis but no evidence of drainable abscess. Posterior pharynx clear. Uvula is midline. Patient handling secretions. There is no tongue elevation. Submental tissues are soft. No cervical adenopathy. No stridor. No hoarseness. Neck supple.  Eyes: Conjunctivae are normal. Right eye exhibits no discharge. Left eye exhibits no discharge.  Neck: Neck supple.  Cardiovascular: Normal rate, regular rhythm and normal heart sounds.  Exam reveals no gallop and no friction rub.   No murmur heard. Pulmonary/Chest: Effort normal and breath sounds normal. No respiratory distress.  Musculoskeletal: He exhibits no edema and no tenderness.  Neurological: He is alert.  Skin: Skin is warm and dry.  Psychiatric: He has a normal mood and affect. His behavior is normal. Thought content normal.    ED Course  NERVE BLOCK Date/Time: 01/17/2012 11:48 PM Performed by: Raeford Razor Authorized by: Raeford Razor Consent: Verbal consent obtained. Risks and benefits: risks, benefits and alternatives were discussed Consent given by: patient Patient identity confirmed: verbally with patient and arm band Indications: pain relief Body area: face/mouth Nerve: inferior alveolar Laterality: left Patient sedated: no Patient position: sitting Needle gauge: 27 G Location technique: anatomical landmarks Local anesthetic: lidocaine 2% with epinephrine Anesthetic total:  1.5 ml Outcome: pain improved Patient tolerance: Patient tolerated the procedure well with no immediate complications.   (including critical care time)  Labs Reviewed - No data to display No results found.   1. Pain, dental       MDM  30yM with dental pain. No evidence of discrete drainable abscess or deep space infection. Handling secretions. Uvula midline. No trismus. Submental tissues soft. Afebrile and  nontoxic appearing. Inferior alveolar block preformed with good resultant analgesia. Plan prn pain meds, abx and dental fu.        Raeford Razor, MD 01/17/12 2351
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
## 2012-01-17 NOTE — ED Notes (Signed)
Pt to ED via [facility] with c/o toothache since this am.

## 2012-01-17 NOTE — ED Notes (Signed)
Pt reports LT lower dental pain.

## 2012-01-17 NOTE — Discharge Instructions (Signed)
Dental Pain A tooth ache may be caused by cavities (tooth decay). Cavities expose the nerve of the tooth to air and hot or cold temperatures. It may come from an infection or abscess (also called a boil or furuncle) around your tooth. It is also often caused by dental caries (tooth decay). This causes the pain you are having. DIAGNOSIS  Your caregiver can diagnose this problem by exam. TREATMENT   If caused by an infection, it may be treated with medications which kill germs (antibiotics) and pain medications as prescribed by your caregiver. Take medications as directed.   Only take over-the-counter or prescription medicines for pain, discomfort, or fever as directed by your caregiver.   Whether the tooth ache today is caused by infection or dental disease, you should see your dentist as soon as possible for further care.  SEEK MEDICAL CARE IF: The exam and treatment you received today has been provided on an emergency basis only. This is not a substitute for complete medical or dental care. If your problem worsens or new problems (symptoms) appear, and you are unable to meet with your dentist, call or return to this location. SEEK IMMEDIATE MEDICAL CARE IF:   You have a fever.   You develop redness and swelling of your face, jaw, or neck.   You are unable to open your mouth.   You have severe pain uncontrolled by pain medicine.  MAKE SURE YOU:   Understand these instructions.   Will watch your condition.   Will get help right away if you are not doing well or get worse.  Document Released: 09/12/2005 Document Revised: 09/01/2011 Document Reviewed: 04/30/2008 [facility] Patient Information 2012 [place], [place].Toothache Toothaches are usually caused by tooth decay (cavity). However, other causes of toothache include:  Gum disease.   Cracked tooth.   Cracked filling.   Injury.   Jaw problem (temporo mandibular joint or TMJ disorder).   Tooth abscess.   Root sensitivity.    Grinding.   Eruption problems.  Swelling and redness around a painful tooth often means you have a dental abscess. Pain medicine and antibiotics can help reduce symptoms, but you will need to see a dentist within the next few days to have your problem properly evaluated and treated. If tooth decay is the problem, you may need a filling or root canal to save your tooth. If the problem is more severe, your tooth may need to be pulled. SEEK IMMEDIATE MEDICAL CARE IF:  You cannot swallow.   You develop severe swelling, increased redness, or increased pain in your mouth or face.   You have a fever.   You cannot open your mouth adequately.  Document Released: 10/20/2004 Document Revised: 09/01/2011 Document Reviewed: 12/10/2009 [facility] Patient Information 2012 [place], [place].  RESOURCE GUIDE  Dental Problems  Patients with Medicaid: [facility] [phone] [address]                                           [phone] [address] Phone:  [phone]  Phone:  [phone]  If unable to pay or uninsured, contact:  [facility] or [facility]. to become qualified for the adult dental clinic.  Chronic Pain Problems Contact [facility]  [phone] Patients need to be referred by their primary care doctor.  Insufficient Money for Medicine Contact [employer]:  call "211" or [facility] 270-598-5461.  No Primary Care Doctor Call [facility]  717-479-6425 Other agencies that provide inexpensive medical care    [facility]  [phone]    [facility]  [phone]    [facility]  [phone]    [facility]  [phone]    [facility]  [phone]    [facility]  [phone]  Psychological Services [facility]  [phone] [facility]  [phone] [facility]   [phone]  (emergency services [phone])  Substance Abuse Resources [facility]  865-535-6934 [facility] 581-347-3300 [facility] [phone] Floydene Flock [phone] Residential & Outpatient Substance Abuse Program  [phone]  Abuse/Neglect [facility] Child Abuse Hotline (858)380-5519 [facility] Child Abuse Hotline (951) 503-7408 (After Hours)  Emergency Shelter [facility] 330-683-1036  Maternity Homes [facility] at [facility] [place] [facility] [place] [phone] [facility] ([phone])[phone]  MRSA Hotline #:   640-757-7368    [facility] Resources  [facility]     [employer]                          [facility] [address] [place]                       [address]      [address]  Blondell Reveal Phone:  [phone]                                   Phone:  [phone]                 Phone:  [phone]  [facility] Phone:  [phone]  [facility] Child Abuse Hotline (412)307-9504 ([phone]) [phone] (After Hours)

## 2012-01-18 MED ORDER — CLINDAMYCIN HCL 300 MG PO CAPS: 300.0000 mg | ORAL_CAPSULE | Freq: Three times a day (TID) | ORAL | Status: AC

## 2013-08-19 IMAGING — CR DG CHEST 2V
2 series · 2 of 2 positions shown · non-contrast
Comparison: 06/17/2011

CLINICAL DATA: Shortness of breath

CHEST - 2 VIEW

[w chest lat]
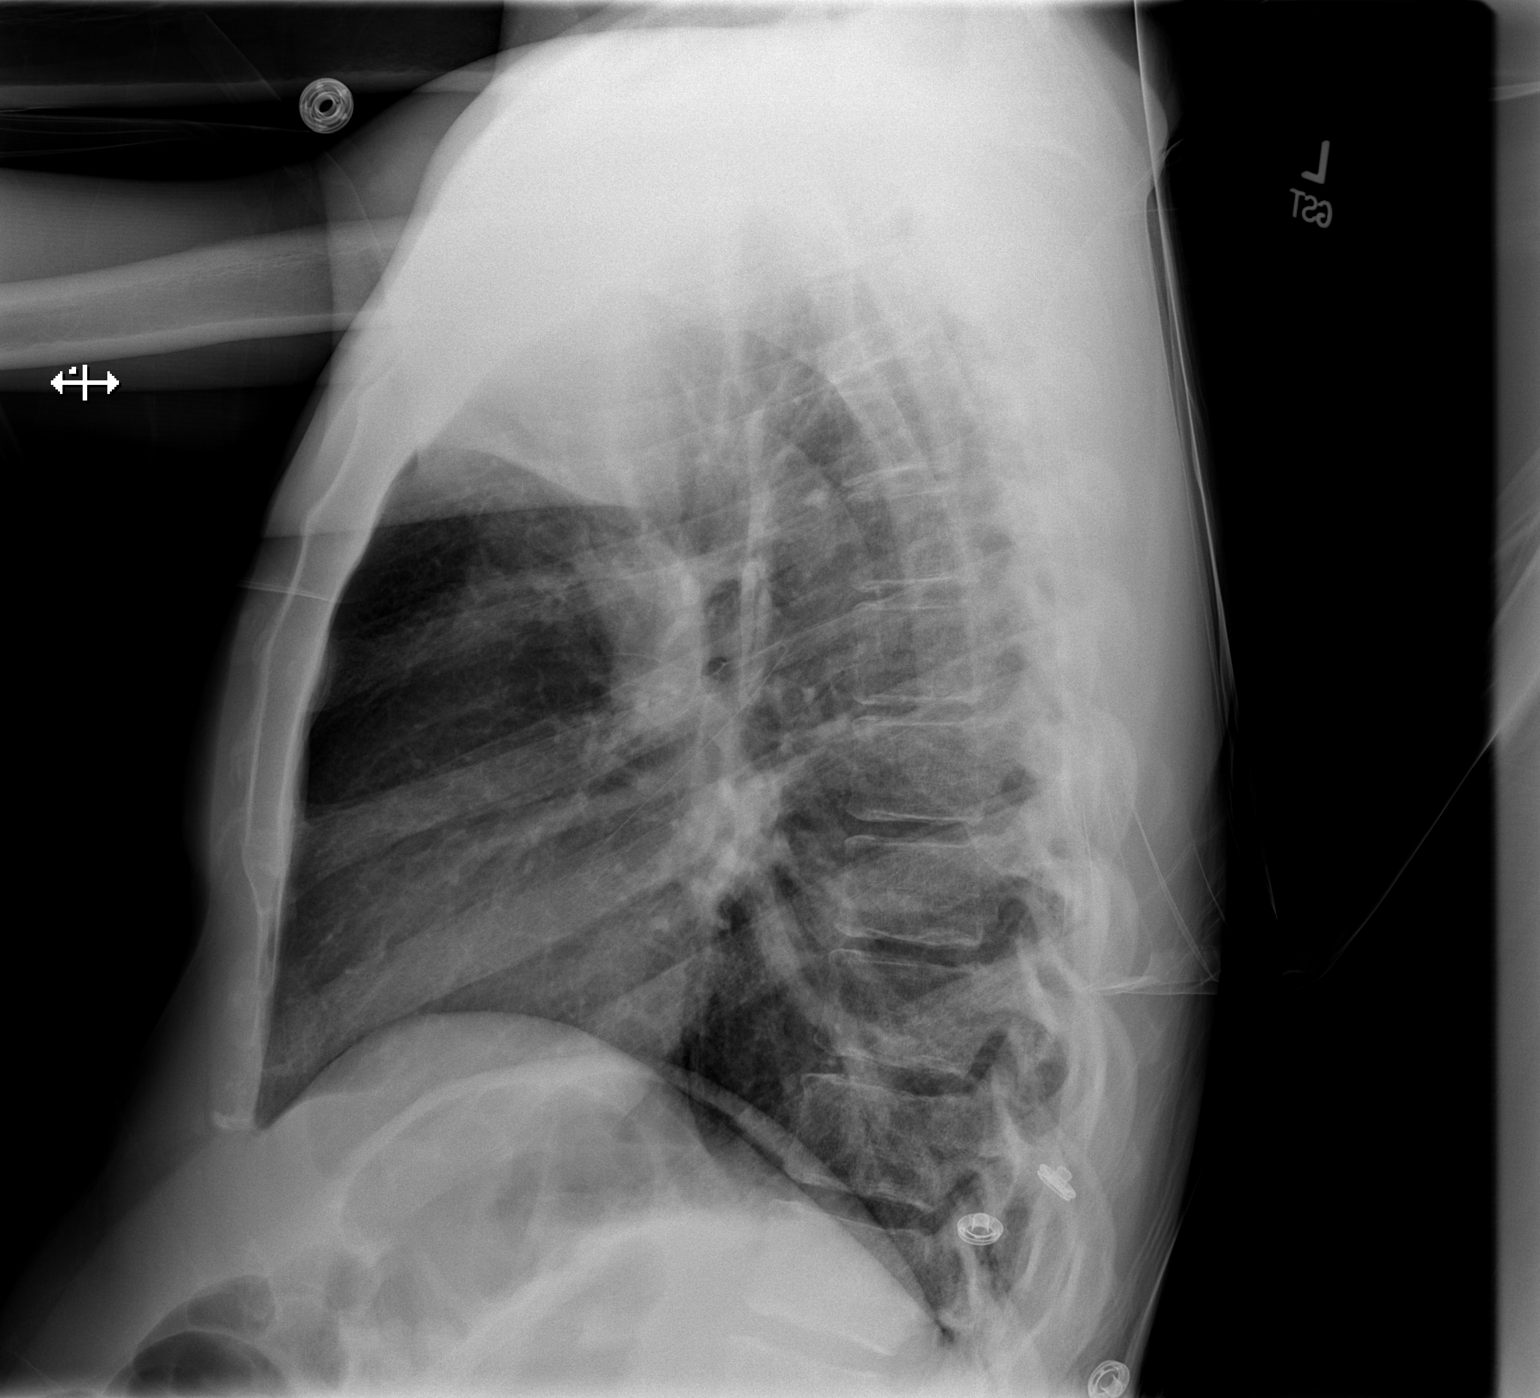

[w chest pa]
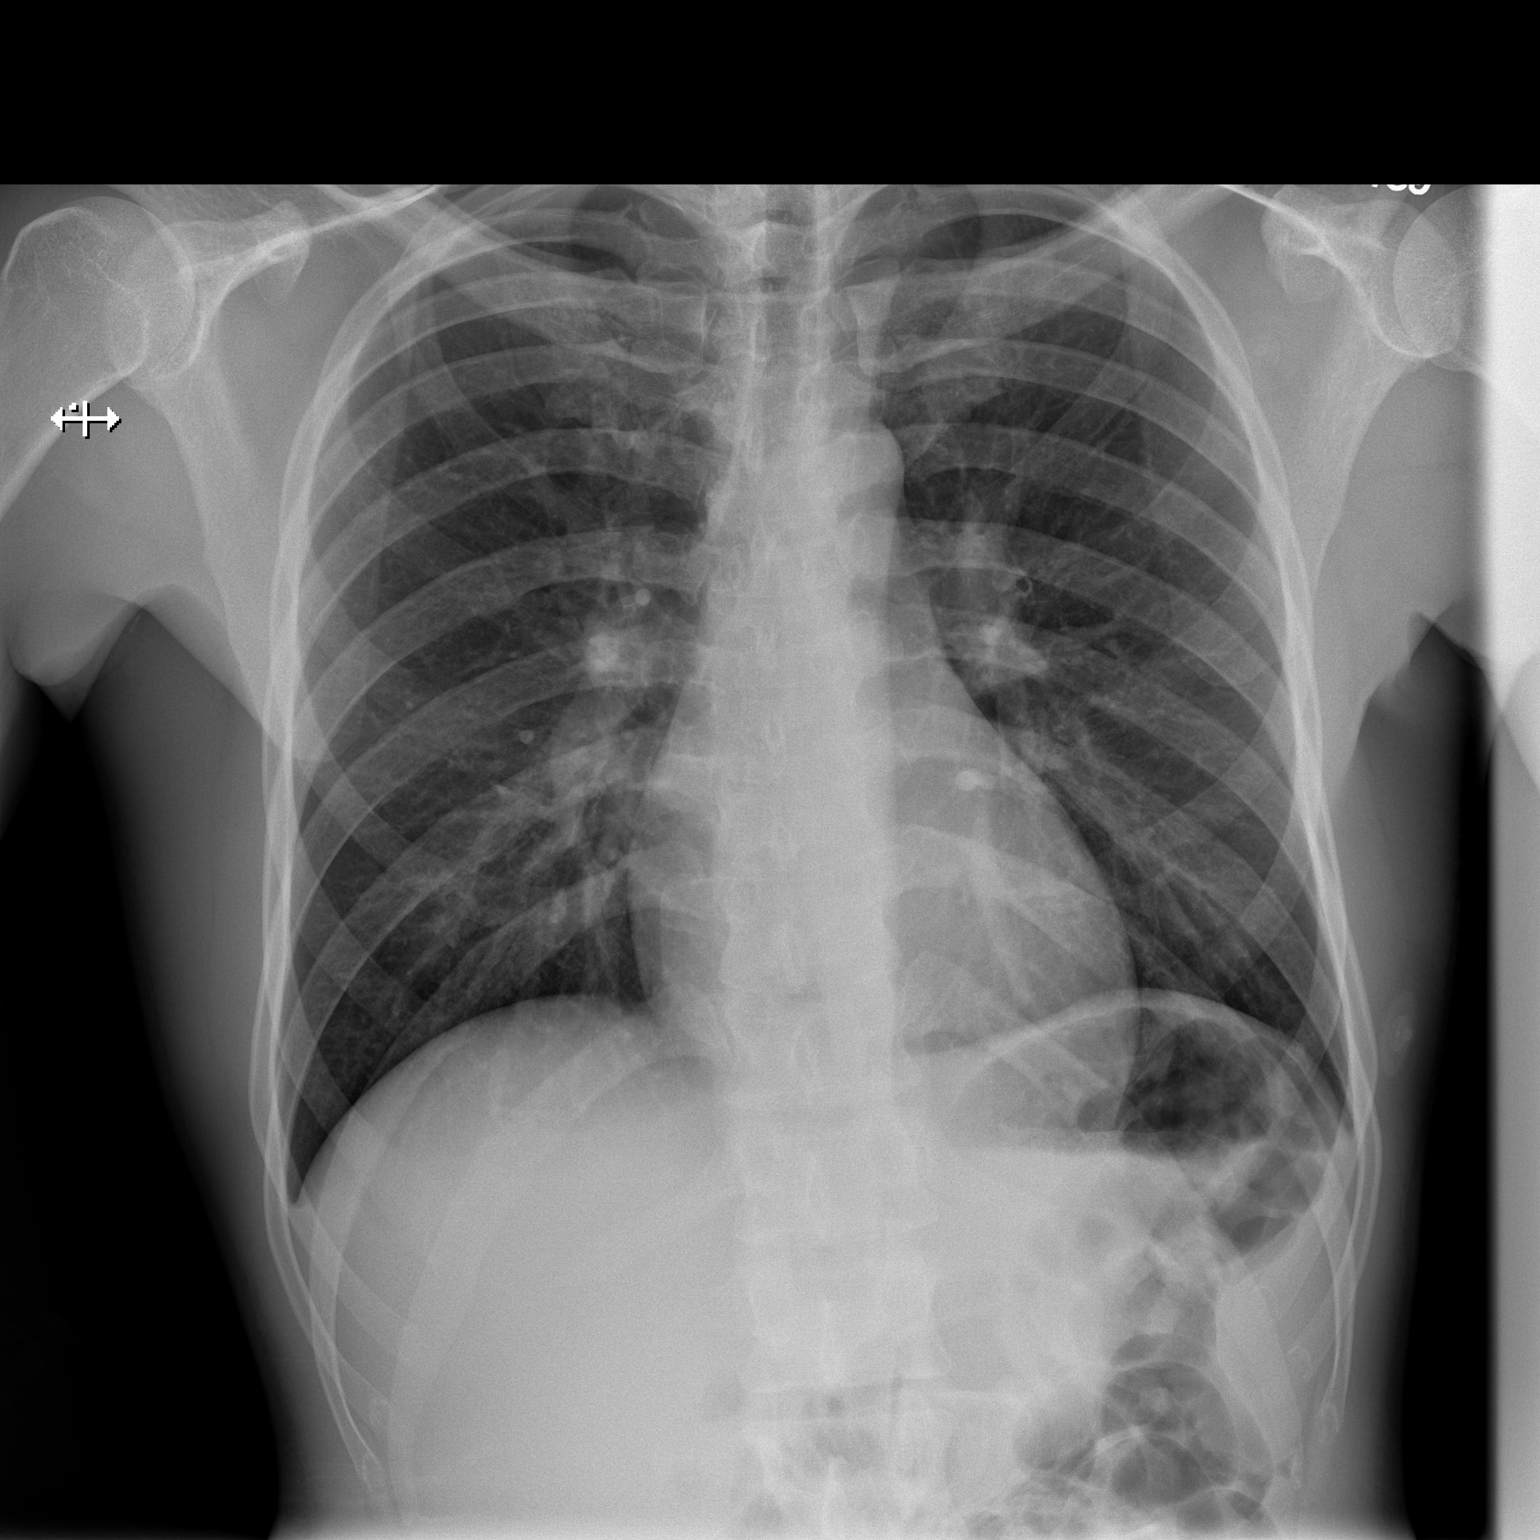

[2 of 2 positions shown; findings below may reference images not displayed]

FINDINGS: Lungs are clear. No pleural effusion or pneumothorax. The
cardiomediastinal contours are within normal limits. The visualized
bones and soft tissues are without significant appreciable
abnormality.
IMPRESSION: No acute process identified.

## 2019-03-08 ENCOUNTER — Encounter (HOSPITAL_COMMUNITY): Payer: Self-pay | Admitting: Emergency Medicine

## 2019-03-08 ENCOUNTER — Other Ambulatory Visit: Payer: Self-pay

## 2019-03-08 ENCOUNTER — Observation Stay (HOSPITAL_COMMUNITY)
Admission: AD | Admit: 2019-03-08 | Discharge: 2019-03-08 | Disposition: A | Discharge status: Home / Self Care | Payer: Self-pay | Source: Intra-hospital | Attending: Psychiatry | Admitting: Psychiatry

## 2019-03-08 ENCOUNTER — Emergency Department (HOSPITAL_COMMUNITY)
Admission: EM | Admit: 2019-03-08 | Discharge: 2019-03-08 | Disposition: A | Discharge status: Other Acute Inpatient Hospital | Payer: Self-pay | Attending: Emergency Medicine | Admitting: Emergency Medicine

## 2019-03-08 ENCOUNTER — Encounter (HOSPITAL_COMMUNITY): Payer: Self-pay | Admitting: *Deleted

## 2019-03-08 DIAGNOSIS — F1290 Cannabis use, unspecified, uncomplicated: Secondary | ICD-10-CM | POA: Insufficient documentation

## 2019-03-08 DIAGNOSIS — Z818 Family history of other mental and behavioral disorders: Secondary | ICD-10-CM | POA: Insufficient documentation

## 2019-03-08 DIAGNOSIS — F17210 Nicotine dependence, cigarettes, uncomplicated: Secondary | ICD-10-CM | POA: Insufficient documentation

## 2019-03-08 DIAGNOSIS — N200 Calculus of kidney: Secondary | ICD-10-CM

## 2019-03-08 DIAGNOSIS — M25552 Pain in left hip: Secondary | ICD-10-CM

## 2019-03-08 DIAGNOSIS — Z7984 Long term (current) use of oral hypoglycemic drugs: Secondary | ICD-10-CM | POA: Insufficient documentation

## 2019-03-08 DIAGNOSIS — E119 Type 2 diabetes mellitus without complications: Secondary | ICD-10-CM | POA: Insufficient documentation

## 2019-03-08 DIAGNOSIS — F4321 Adjustment disorder with depressed mood: Principal | ICD-10-CM | POA: Diagnosis present

## 2019-03-08 DIAGNOSIS — R45851 Suicidal ideations: Secondary | ICD-10-CM | POA: Insufficient documentation

## 2019-03-08 DIAGNOSIS — Z880 Allergy status to penicillin: Secondary | ICD-10-CM | POA: Insufficient documentation

## 2019-03-08 DIAGNOSIS — Z79899 Other long term (current) drug therapy: Secondary | ICD-10-CM | POA: Insufficient documentation

## 2019-03-08 DIAGNOSIS — Z20828 Contact with and (suspected) exposure to other viral communicable diseases: Secondary | ICD-10-CM | POA: Insufficient documentation

## 2019-03-08 DIAGNOSIS — Z1159 Encounter for screening for other viral diseases: Secondary | ICD-10-CM | POA: Insufficient documentation

## 2019-03-08 DIAGNOSIS — F329 Major depressive disorder, single episode, unspecified: Secondary | ICD-10-CM | POA: Insufficient documentation

## 2019-03-08 DIAGNOSIS — F1910 Other psychoactive substance abuse, uncomplicated: Secondary | ICD-10-CM | POA: Insufficient documentation

## 2019-03-08 HISTORY — DX: Calculus of kidney: N20.0

## 2019-03-08 HISTORY — DX: Depression, unspecified: F32.A

## 2019-03-08 LAB — CBC
HCT: 43.6 % (ref 39.0–52.0)
Hemoglobin: 14.4 g/dL (ref 13.0–17.0)
MCH: 29.2 pg (ref 26.0–34.0)
MCHC: 33 g/dL (ref 30.0–36.0)
MCV: 88.4 fL (ref 80.0–100.0)
Platelets: 274 10*3/uL (ref 150–400)
RBC: 4.93 MIL/uL (ref 4.22–5.81)
RDW: 13.9 % (ref 11.5–15.5)
WBC: 7.7 10*3/uL (ref 4.0–10.5)
nRBC: 0 % (ref 0.0–0.2)

## 2019-03-08 LAB — ACETAMINOPHEN LEVEL: Acetaminophen (Tylenol), Serum: <10 ug/mL — ABNORMAL LOW (ref 10–30)

## 2019-03-08 LAB — RAPID URINE DRUG SCREEN, HOSP PERFORMED
Amphetamines: POSITIVE — AB
Barbiturates: NOT DETECTED
Benzodiazepines: NOT DETECTED
Cocaine: NOT DETECTED
Opiates: NOT DETECTED
Tetrahydrocannabinol: POSITIVE — AB

## 2019-03-08 LAB — COMPREHENSIVE METABOLIC PANEL
ALT: 16 U/L (ref 0–44)
AST: 24 U/L (ref 15–41)
Albumin: 4 g/dL (ref 3.5–5.0)
Alkaline Phosphatase: 60 U/L (ref 38–126)
Anion gap: 6 (ref 5–15)
BUN: 15 mg/dL (ref 6–20)
CO2: 27 mmol/L (ref 22–32)
Calcium: 9.3 mg/dL (ref 8.9–10.3)
Chloride: 104 mmol/L (ref 98–111)
Creatinine, Ser: 0.95 mg/dL (ref 0.61–1.24)
GFR calc Af Amer: >60 mL/min (ref >60)
GFR calc non Af Amer: >60 mL/min (ref >60)
Glucose, Bld: 89 mg/dL (ref 70–99)
Potassium: 3.3 mmol/L — ABNORMAL LOW (ref 3.5–5.1)
Sodium: 137 mmol/L (ref 135–145)
Total Bilirubin: 1.9 mg/dL — ABNORMAL HIGH (ref 0.3–1.2)
Total Protein: 6.9 g/dL (ref 6.5–8.1)

## 2019-03-08 LAB — SALICYLATE LEVEL: Salicylate Lvl: <7 mg/dL (ref 2.8–30.0)

## 2019-03-08 LAB — ETHANOL: Alcohol, Ethyl (B): <10 mg/dL (ref <10)

## 2019-03-08 LAB — SARS CORONAVIRUS 2: SARS Coronavirus 2: NOT DETECTED

## 2019-03-08 MED ORDER — ALUM & MAG HYDROXIDE-SIMETH 200-200-20 MG/5ML PO SUSP: 30.0000 mL | ORAL | Status: DC | PRN

## 2019-03-08 MED ORDER — ENSURE ENLIVE PO LIQD
237.0000 mL | Freq: Two times a day (BID) | ORAL | Status: DC
  Administered 2019-03-08: 14:00:00 237 mL via ORAL

## 2019-03-08 MED ORDER — MAGNESIUM HYDROXIDE 400 MG/5ML PO SUSP: 30.0000 mL | Freq: Every day | ORAL | Status: DC | PRN

## 2019-03-08 MED ORDER — HYDROXYZINE HCL 25 MG PO TABS
25.0000 mg | ORAL_TABLET | Freq: Three times a day (TID) | ORAL | Status: DC | PRN
  Administered 2019-03-08: 25 mg via ORAL
  Filled 2019-03-08: qty 1

## 2019-03-08 MED ORDER — METFORMIN HCL 500 MG PO TABS: 500.0000 mg | ORAL_TABLET | Freq: Two times a day (BID) | ORAL | Status: DC

## 2019-03-08 MED ORDER — FLUOXETINE HCL 20 MG PO CAPS
20.0000 mg | ORAL_CAPSULE | Freq: Every day | ORAL | Status: DC
  Administered 2019-03-08: 20 mg via ORAL
  Filled 2019-03-08: qty 1

## 2019-03-08 MED ORDER — ACETAMINOPHEN 325 MG PO TABS: 650.0000 mg | ORAL_TABLET | Freq: Four times a day (QID) | ORAL | Status: DC | PRN

## 2019-03-08 MED ORDER — GABAPENTIN 300 MG PO CAPS
300.0000 mg | ORAL_CAPSULE | Freq: Three times a day (TID) | ORAL | Status: DC
  Administered 2019-03-08: 300 mg via ORAL
  Filled 2019-03-08: qty 1

## 2019-03-08 MED ORDER — FLUOXETINE HCL 20 MG PO CAPS
20.0000 mg | ORAL_CAPSULE | Freq: Every day | ORAL | Status: DC
  Filled 2019-03-08: qty 1

## 2019-03-08 MED ORDER — IBUPROFEN 600 MG PO TABS: 600.0000 mg | ORAL_TABLET | Freq: Four times a day (QID) | ORAL | Status: DC | PRN

## 2019-03-08 MED ORDER — IBUPROFEN 400 MG PO TABS: 600.0000 mg | ORAL_TABLET | Freq: Four times a day (QID) | ORAL | Status: DC | PRN

## 2019-03-08 MED ORDER — NICOTINE 21 MG/24HR TD PT24
21.0000 mg | MEDICATED_PATCH | Freq: Every day | TRANSDERMAL | Status: DC
  Administered 2019-03-08: 14:00:00 21 mg via TRANSDERMAL
  Filled 2019-03-08: qty 1
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
## 2019-03-08 NOTE — ED Notes (Signed)
Pt encouraged to contact wife to let her know of disposition to [facility]
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
## 2019-03-08 NOTE — ED Notes (Addendum)
Pt reports legal name change to "Ryan Duran"

## 2019-03-08 NOTE — Progress Notes (Signed)
Patient ID: Ryan Duran, male   DOB: [birth year], 38 y.o.   MRN: [id number]  D: Pt alert and oriented on the unit.   A: Education, support, and encouragement provided. Discharge summary, medications and follow up appointments and/or referrals reviewed with pt. Suicide prevention resources provided, including "My 3 App." Pt's belongings in locker # 5 returned and belongings sheet signed.  R: Pt denies SI/HI, A/VH, pain, or any concerns at this time. Pt ambulatory on and off unit. Pt discharged to lobby.

## 2019-03-08 NOTE — Plan of Care (Signed)
[facility] Observation Crisis Plan  Reason for Crisis Plan:  Crisis Stabilization and Medication Management   Plan of Care:  Referral for Telepsychiatry/Psychiatric Consult  Family Support:      Current Living Environment:  Living Arrangements: Other relatives  Insurance:   Hospital Account    Name Acct ID Class Status Primary Coverage   Kaien, Pezzullo [id number] [place] Open None        Guarantor Account (for Hospital Account [id number])    Name Relation to Pt Service Area Active? Acct Type   Rozetta Nunnery Self [facility] Yes Behavioral Health   Address Phone       [address] [place], [place] [address] [phone])          Coverage Information (for Hospital Account [id number])    Not on file      Legal Guardian:     Primary Care Provider:  Patient, No Pcp Per  Current Outpatient Providers:  N/A  Psychiatrist:   Mariea Clonts, MD  Counselor/Therapist:   N/A  Compliant with Medications:  Yes  Additional Information:   Harriet Masson 6/12/202012:00 PM

## 2019-03-08 NOTE — ED Notes (Signed)
bfast ordered 

## 2019-03-08 NOTE — ED Notes (Signed)
Family at bedside. 
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
## 2019-03-08 NOTE — BH Assessment (Signed)
[facility] Assessment Progress Note  Per Buford Dresser, DO, this pt does not require psychiatric hospitalization at this time.  Pt is to be discharged from the [facility] Observation Unit with referral information for [facility] [place].  This has been included in pt's discharge instructions.  Pt's nurse, Elberta Fortis, has been notified.  Jalene Mullet, [place] Triage Specialist [phone]

## 2019-03-08 NOTE — ED Notes (Signed)
TTS in process 
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
## 2019-03-08 NOTE — BHH Counselor (Signed)
TTS left a HIPPA compliant voicemail for patient's wife, Jonelle Sidle [phone]) to obtain collateral information.
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
## 2019-03-08 NOTE — BH Assessment (Addendum)
[facility] Assessment Progress Note   Clinician informed Dr. Leonides Schanz that pt can come to [facility] OBS unit after 08:00.  Nurse call report to [phone].
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
## 2019-03-08 NOTE — Discharge Instructions (Signed)
For your behavioral health needs, you are advised to follow up with [facility].  Call them at your earliest opportunity to schedule an intake appointment: ° °     [facility] °     [address] °     [place], [place] [address] °     [phone] °
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
## 2019-03-08 NOTE — Progress Notes (Signed)
Patient ID: Ryan Duran, male   DOB: [birth year], 39 y.o.   MRN: [id number]  [place] NOVEL CORONAVIRUS (COVID-19) DAILY CHECK-OFF SYMPTOMS - answer yes or no to each - every day NO YES  Have you had a fever in the past 24 hours?  . Fever (Temp > 37.80C / 100F) X   Have you had any of these symptoms in the past 24 hours? . New Cough .  Sore Throat  .  Shortness of Breath .  Difficulty Breathing .  Unexplained Body Aches   X   Have you had any one of these symptoms in the past 24 hours not related to allergies?   . Runny Nose .  Nasal Congestion .  Sneezing   X   If you have had runny nose, nasal congestion, sneezing in the past 24 hours, has it worsened?  X   EXPOSURES - check yes or no X   Have you traveled outside the state in the past 14 days?  X   Have you been in contact with someone with a confirmed diagnosis of COVID-19 or PUI in the past 14 days without wearing appropriate PPE?  X   Have you been living in the same home as a person with confirmed diagnosis of COVID-19 or a PUI (household contact)?    X   Have you been diagnosed with COVID-19?    X              What to do next: Answered NO to all: Answered YES to anything:   Proceed with unit schedule Follow the [facility] Inpatient Flowsheet.
